# Patient Record
Sex: Female | Born: 1980 | Race: Asian | Hispanic: No | Marital: Single | Smoking: Never smoker
Health system: Southern US, Community
[De-identification: ages and names within clinical notes are randomized; demographics above are authoritative.]

---

## 2019-05-07 ENCOUNTER — Other Ambulatory Visit: Payer: Self-pay

## 2019-05-07 ENCOUNTER — Emergency Department (HOSPITAL_BASED_OUTPATIENT_CLINIC_OR_DEPARTMENT_OTHER): Payer: Medicaid Other

## 2019-05-07 ENCOUNTER — Emergency Department (HOSPITAL_BASED_OUTPATIENT_CLINIC_OR_DEPARTMENT_OTHER)
Admission: EM | Admit: 2019-05-07 | Discharge: 2019-05-07 | Disposition: A | Discharge status: Home / Self Care | Payer: Medicaid Other | Attending: Emergency Medicine | Admitting: Emergency Medicine

## 2019-05-07 ENCOUNTER — Encounter (HOSPITAL_BASED_OUTPATIENT_CLINIC_OR_DEPARTMENT_OTHER): Payer: Self-pay | Admitting: Emergency Medicine

## 2019-05-07 DIAGNOSIS — Y998 Other external cause status: Secondary | ICD-10-CM | POA: Diagnosis not present

## 2019-05-07 DIAGNOSIS — S8991XA Unspecified injury of right lower leg, initial encounter: Secondary | ICD-10-CM | POA: Diagnosis present

## 2019-05-07 DIAGNOSIS — S8012XA Contusion of left lower leg, initial encounter: Secondary | ICD-10-CM | POA: Diagnosis not present

## 2019-05-07 DIAGNOSIS — Y9389 Activity, other specified: Secondary | ICD-10-CM | POA: Insufficient documentation

## 2019-05-07 DIAGNOSIS — Y92810 Car as the place of occurrence of the external cause: Secondary | ICD-10-CM | POA: Insufficient documentation

## 2019-05-07 DIAGNOSIS — S8001XA Contusion of right knee, initial encounter: Secondary | ICD-10-CM | POA: Diagnosis not present

## 2019-05-07 DIAGNOSIS — Z23 Encounter for immunization: Secondary | ICD-10-CM | POA: Insufficient documentation

## 2019-05-07 DIAGNOSIS — M25511 Pain in right shoulder: Secondary | ICD-10-CM | POA: Diagnosis not present

## 2019-05-07 DIAGNOSIS — T148XXA Other injury of unspecified body region, initial encounter: Secondary | ICD-10-CM

## 2019-05-07 MED ORDER — ACETAMINOPHEN 500 MG PO TABS: 1000.0000 mg | ORAL_TABLET | Freq: Once | ORAL | Status: DC

## 2019-05-07 MED ORDER — BACITRACIN ZINC 500 UNIT/GM EX OINT
TOPICAL_OINTMENT | Freq: Once | CUTANEOUS | Status: AC
  Administered 2019-05-07: 19:00:00 via TOPICAL
  Filled 2019-05-07: qty 28.35

## 2019-05-07 MED ORDER — TETANUS-DIPHTH-ACELL PERTUSSIS 5-2.5-18.5 LF-MCG/0.5 IM SUSP
0.5000 mL | Freq: Once | INTRAMUSCULAR | Status: AC
  Administered 2019-05-07: 18:00:00 0.5 mL via INTRAMUSCULAR
  Filled 2019-05-07: qty 0.5

## 2019-05-07 MED ORDER — IBUPROFEN 800 MG PO TABS
800.0000 mg | ORAL_TABLET | Freq: Once | ORAL | Status: AC
  Administered 2019-05-07: 18:00:00 800 mg via ORAL
  Filled 2019-05-07: qty 1

## 2019-05-07 NOTE — ED Notes (Signed)
Applied bacitracin and curlex bilaterally to knees
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
## 2019-05-07 NOTE — ED Notes (Signed)
Patient transported to X-ray 

## 2019-05-07 NOTE — Discharge Instructions (Signed)
You can take Tylenol or Ibuprofen as directed for pain. You can alternate Tylenol and Ibuprofen every 4 hours. If you take Tylenol at 1pm, then you can take Ibuprofen at 5pm. Then you can take Tylenol again at 9pm.   Follow the RICE (Rest, Ice, Compression, Elevation) protocol as directed.   Gently clean the wound with soap and water. Make sure to pat dry the wound before covering it with any dressing. You can use topical antibiotic ointment and bandage. Ice and elevate for pain relief.   Monitor closely for any signs of infection. Return to the Emergency Department for any worsening redness/swelling of the area that begins to spread, drainage from the site, worsening pain, fever or any other worsening or concerning symptoms.
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
## 2020-09-17 ENCOUNTER — Encounter: Payer: Self-pay | Admitting: Neurology

## 2020-09-17 ENCOUNTER — Telehealth: Payer: Self-pay | Admitting: Neurology

## 2020-09-17 ENCOUNTER — Encounter: Payer: No Typology Code available for payment source | Admitting: Neurology

## 2020-09-17 NOTE — Telephone Encounter (Signed)
This patient did not show for EMG and nerve conduction study evaluation
